# Patient Record
Sex: Male | Born: 1985 | Race: White | Hispanic: No | Marital: Single | Smoking: Current every day smoker
Health system: Southern US, Community
[De-identification: ages and names within clinical notes are randomized; demographics above are authoritative.]

## PROBLEM LIST (undated history)

## (undated) DIAGNOSIS — C801 Malignant (primary) neoplasm, unspecified: Secondary | ICD-10-CM

## (undated) HISTORY — PX: WISDOM TOOTH EXTRACTION: SHX21

---

## 2017-09-21 ENCOUNTER — Emergency Department (HOSPITAL_COMMUNITY)
Admission: EM | Admit: 2017-09-21 | Discharge: 2017-09-21 | Disposition: A | Discharge status: Home / Self Care | Payer: Self-pay | Attending: Emergency Medicine | Admitting: Emergency Medicine

## 2017-09-21 ENCOUNTER — Emergency Department (HOSPITAL_COMMUNITY): Payer: Self-pay

## 2017-09-21 ENCOUNTER — Encounter (HOSPITAL_COMMUNITY): Payer: Self-pay | Admitting: Emergency Medicine

## 2017-09-21 DIAGNOSIS — F17210 Nicotine dependence, cigarettes, uncomplicated: Secondary | ICD-10-CM | POA: Insufficient documentation

## 2017-09-21 DIAGNOSIS — S92012D Displaced fracture of body of left calcaneus, subsequent encounter for fracture with routine healing: Secondary | ICD-10-CM | POA: Insufficient documentation

## 2017-09-21 DIAGNOSIS — W12XXXD Fall on and from scaffolding, subsequent encounter: Secondary | ICD-10-CM | POA: Insufficient documentation

## 2017-09-21 DIAGNOSIS — Z4789 Encounter for other orthopedic aftercare: Secondary | ICD-10-CM | POA: Insufficient documentation

## 2017-09-21 DIAGNOSIS — S92012A Displaced fracture of body of left calcaneus, initial encounter for closed fracture: Secondary | ICD-10-CM

## 2017-09-21 NOTE — ED Triage Notes (Signed)
Pt reports to ED stating that his orthopedic office instructed him to come to ED for possible surgery on left heel. Patient broke foot couple weeks ago and in cast. Reports heel is very fractured.

## 2017-09-21 NOTE — ED Provider Notes (Signed)
[facility] DEPT Provider Note   CSN: [id number] Arrival date & time: 09/21/17  0847     History   Chief Complaint Chief Complaint  Patient presents with  . sent by ortho    HPI Sean Hoover is a 32 y.o. male.  Patient is a 32 year old male who presents with a calcaneus fracture.  He states that 2 weeks ago, he fell off some scaffolding and landed on his feet.  He had pain in his left heel.  He went to [place] where he had x-rays and was diagnosed with a calcaneus fracture.  He was placed in a cast.  He was told that it may need surgery.  He called the office yesterday in regards to further instructions and he was told to go to the emergency room for further treatment.  He has some ongoing throbbing pain but overall says it is doing well.  He denies any numbness in the toes.  He denies any other injuries from the fall.  No neck or back pain.     History reviewed. No pertinent past medical history.  There are no active problems to display for this patient.   History reviewed. No pertinent surgical history.      Home Medications    Prior to Admission medications   Medication Sig Start Date End Date Taking? Authorizing Provider  ibuprofen (ADVIL,MOTRIN) 200 MG tablet Take 400-600 mg by mouth every 6 (six) hours as needed.   Yes [provider]    Family History No family history on file.  Social History Social History   Tobacco Use  . Smoking status: Current Every Day Smoker    Types: Cigarettes  . Smokeless tobacco: Never Used  Substance Use Topics  . Alcohol use: Not on file  . Drug use: Not on file     Allergies   Penicillins   Review of Systems Review of Systems  Constitutional: Negative for fever.  Gastrointestinal: Negative for nausea and vomiting.  Musculoskeletal: Positive for arthralgias. Negative for back pain, joint swelling and neck pain.  Skin: Negative for wound.  Neurological: Negative  for weakness, numbness and headaches.     Physical Exam Updated Vital Signs BP 136/81 (BP Location: Left Arm)   Pulse 64   Temp 97.7 F (36.5 C) (Oral)   Resp 18   SpO2 98%   Physical Exam  Constitutional: He is oriented to person, place, and time. He appears well-developed and well-nourished.  HENT:  Head: Normocephalic and atraumatic.  Neck: Normal range of motion. Neck supple.  Cardiovascular: Normal rate.  Pulmonary/Chest: Effort normal.  Musculoskeletal: He exhibits no edema or tenderness.  Patient has a short leg cast in place on his left lower leg.  He has good perfusion and capillary refill to his toes.  He has normal sensation and motor function in his toes.  Neurological: He is alert and oriented to person, place, and time.  Skin: Skin is warm and dry.  Psychiatric: He has a normal mood and affect.     ED Treatments / Results  Labs (all labs ordered are listed, but only abnormal results are displayed) Labs Reviewed - No data to display  EKG None  Radiology Ct Foot Left Wo Contrast  Result Date: 09/21/2017 CLINICAL DATA:  Fracture of the calcaneus.  Possible ORIF. EXAM: CT OF THE LEFT FOOT WITHOUT CONTRAST TECHNIQUE: Multidetector CT imaging of the left foot was performed according to the standard protocol. Multiplanar CT image reconstructions were also  generated. COMPARISON:  None. FINDINGS: Bones/Joint/Cartilage Comminuted fracture of mid and posterior calcaneus with the fracture cleft extending to the middle subtalar joint and anterior margin of the posterior subtalar joint. Posterior subtalar joint articular surface is depressed 3 mm. Fracture involves the sustentaculum talus without displacement. No other acute fracture or dislocation. No aggressive osseous lesion. Ankle mortise is intact. Small anterior tibiotalar marginal osteophytes. 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 Ligaments Suboptimally assessed by CT. Muscles and Tendons Muscles are normal. No  muscle atrophy. Flexor, extensor, peroneal and Achilles tendons are grossly intact. Soft tissues No fluid collection or hematoma. Soft tissue edema around the calcaneus. IMPRESSION: 1. Comminuted fracture of mid and posterior calcaneus with the fracture cleft extending to the middle subtalar joint and anterior margin of the posterior subtalar joint. Posterior subtalar joint articular surface is depressed 3 mm. Fracture involves the sustentaculum talus without displacement. Electronically Signed   By: Kathreen Devoid   On: 09/21/2017 11:35    Procedures Procedures (including critical care time)  Medications Ordered in ED Medications - No data to display   Initial Impression / Assessment and Plan / ED Course  I have reviewed the triage vital signs and the nursing notes.  Pertinent labs & imaging results that were available during my care of the patient were reviewed by me and considered in my medical decision making (see chart for details).     I spoke with [facility] orthopedics who recommends getting a CT scan of the patient's calcaneus.  This was performed and the orthopedist has reviewed the images.  They have recommended that the patient follow-up with Dr. [name] in their office on Wednesday.  Patient was notified that he needs to call and make the appointment.  He knows all the prior instructions such as being nonweightbearing and keeping the foot elevated.  Final Clinical Impressions(s) / ED Diagnoses   Final diagnoses:  Closed displaced fracture of body of left calcaneus, initial encounter    ED Discharge Orders    None       Malvin Johns, MD 09/21/17 1213

## 2017-09-21 NOTE — ED Notes (Signed)
Pt stated he does not want his vital signs taken at the time of discharge. No acute distress noted. Pain in left foot 3/10.

## 2017-09-28 ENCOUNTER — Other Ambulatory Visit: Payer: Self-pay

## 2017-09-28 ENCOUNTER — Encounter (HOSPITAL_COMMUNITY): Payer: Self-pay | Admitting: *Deleted

## 2017-10-01 ENCOUNTER — Encounter (HOSPITAL_COMMUNITY): Payer: Self-pay | Admitting: Anesthesiology

## 2017-10-01 NOTE — Anesthesia Preprocedure Evaluation (Addendum)
Anesthesia Evaluation  Patient identified by MRN, date of birth, ID band Patient awake    Reviewed: Allergy & Precautions, NPO status , Patient's Chart, lab work & pertinent test results  Airway Mallampati: I  TM Distance: >3 FB Neck ROM: Full    Dental  (+) Teeth Intact, Dental Advisory Given   Pulmonary Current Smoker,    breath sounds clear to auscultation       Cardiovascular negative cardio ROS   Rhythm:Regular Rate:Normal     Neuro/Psych negative neurological ROS     GI/Hepatic negative GI ROS, Neg liver ROS,   Endo/Other  negative endocrine ROS  Renal/GU negative Renal ROS     Musculoskeletal negative musculoskeletal ROS (+)   Abdominal Normal abdominal exam  (+)   Peds  Hematology negative hematology ROS (+)   Anesthesia Other Findings   Reproductive/Obstetrics                            Anesthesia Physical Anesthesia Plan  ASA: I  Anesthesia Plan: General   Post-op Pain Management: GA combined w/ Regional for post-op pain   Induction: Intravenous  PONV Risk Score and Plan: 2 and Ondansetron, Dexamethasone and Midazolam  Airway Management Planned: Oral ETT  Additional Equipment: None  Intra-op Plan:   Post-operative Plan: Extubation in OR  Informed Consent: I have reviewed the patients History and Physical, chart, labs and discussed the procedure including the risks, benefits and alternatives for the proposed anesthesia with the patient or authorized representative who has indicated his/her understanding and acceptance.   Dental advisory given  Plan Discussed with: CRNA  Anesthesia Plan Comments:       Anesthesia Quick Evaluation

## 2017-10-01 NOTE — H&P (Signed)
Orthopaedic Trauma Service (OTS) Consult   Patient ID: Sean Hoover MRN: [id number] DOB/AGE: 33/05/1985 33 y.o.   HPI: Sean Hoover is an 32 y.o. white male who sustained an injury to his left calcaneus approximately 3 weeks ago.  Patient was injured while at work when he fell off a scaffold.  Patient initially was seen and evaluated at [facility] and referred for CT scan he is subsequently followed up in the emergency department where CT scan was performed which demonstrated a severely comminuted left calcaneus fracture he was then referred to the orthopedic trauma specialist for further management.  Patient was seen and evaluated in our office last week.  His soft tissue swelling was controlled well enough to allow for surgical intervention.  Patient presents today for ORIF of his left calcaneus.  Patient has not been on any anticoagulation  Patient does smoke about 1/2 pack a day. No diabetes no other pertinent medical history  Patient is very active, works in [employer]  Patient does have a penicillin allergy but is uncertain if he is tolerant to cephalosporins  Past Medical History:  Diagnosis Date  . 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 WISDOM TOOTH EXTRACTION      History reviewed. No pertinent family history.  Social History:  reports that he has been smoking cigarettes.  He has a 12.00 pack-year smoking history. His smokeless tobacco use includes chew. He reports that he does not drink alcohol or use drugs.  Allergies:  Allergies  Allergen Reactions  . Penicillins Hives and Rash    Has patient had a PCN reaction causing immediate rash, facial/tongue/throat swelling, SOB or lightheadedness with hypotension: No Has patient had a PCN reaction causing severe rash involving mucus membranes or skin necrosis: No Has patient had a PCN reaction that required hospitalization: No Has  patient had a PCN reaction occurring within the last 10 years: No If all of the above answers are "NO", then may proceed with Cephalosporin use.      Medications: I have reviewed the patient's current medications. No outpatient medications have been marked as taking for the 10/02/17 encounter [facility] Encounter).    Labs Pending  Review of Systems  Constitutional: Negative for chills and fever.  Respiratory: Negative for shortness of breath and wheezing.   Cardiovascular: Negative for chest pain and palpitations.  Gastrointestinal: Negative for nausea and vomiting.  Genitourinary: Negative for dysuria.  Neurological: Negative for tingling and sensory change.   Height 6\' 1"  (1.854 m), weight 65.3 kg (144 lb).  Vital signs on arrival to short stay  Physical Exam  Constitutional: He is oriented to person, place, and time. He appears well-developed and well-nourished.  HENT:  Head: Normocephalic and atraumatic.  Neck: Normal range of motion.  Cardiovascular: Normal rate, regular rhythm and normal heart sounds.  Pulmonary/Chest: Effort normal. No respiratory distress. He has no wheezes.  Decreased at bases  Abdominal: Soft. Bowel sounds are normal. He exhibits no distension. There is no tenderness.  Musculoskeletal:  Left lower extremity Soft tissue over the lateral aspect of the left hindfoot is in good condition skin wrinkles with gentle compression Lower extremity with minimal swelling including the foot No significant pain with ankle range of motion minimal discomfort with subtalar motion Extremity is warm + DP pulse Knee is nontender No pain with axial loading or logrolling of the hip Full knee and hip range of motion DPN,  SPN, TN sensory functions are intact EHL, FHL, anterior tibialis, posterior tibialis, peroneals and gastrocsoleus complex motor functions are intact Compartments are soft No deep calf tenderness  Neurological: He is alert and oriented to person, place,  and time.  Skin: Skin is warm and dry. Capillary refill takes less than 2 seconds.  Psychiatric: He has a normal mood and affect. His behavior is normal. Judgment and thought content normal.     Assessment/Plan:  32 year old white male with depressed and comminuted intra-articular left calcaneus fracture  -Comminuted left calcaneus fracture  OR for ORIF to restore height and stability  Nonweightbearing postoperatively for 8 weeks  Admit postoperatively for pain control and therapy   Okay with preoperative block   Multimodal analgesia   Risks and benefits reviewed with the patient, he wishes to proceed   Did discuss that nicotine use would increase his risk for the development of infection and or nonunion    - Dispo:  OR for ORIF left calcaneus     Jari Pigg, PA-C [facility] [phone] (C) [phone] (O) 10/01/2017, 7:53 PM

## 2017-10-02 ENCOUNTER — Ambulatory Visit (HOSPITAL_COMMUNITY): Payer: Self-pay | Admitting: Anesthesiology

## 2017-10-02 ENCOUNTER — Ambulatory Visit (HOSPITAL_COMMUNITY): Payer: Self-pay

## 2017-10-02 ENCOUNTER — Encounter (HOSPITAL_COMMUNITY): Payer: Self-pay | Admitting: *Deleted

## 2017-10-02 ENCOUNTER — Ambulatory Visit (HOSPITAL_COMMUNITY)
Admission: RE | Admit: 2017-10-02 | Discharge: 2017-10-02 | Disposition: A | Discharge status: Home / Self Care | Payer: Self-pay | Source: Ambulatory Visit | Attending: Orthopedic Surgery | Admitting: Orthopedic Surgery

## 2017-10-02 ENCOUNTER — Encounter (HOSPITAL_COMMUNITY)
Admission: RE | Disposition: A | Discharge status: Home / Self Care | Payer: Self-pay | Source: Ambulatory Visit | Attending: Orthopedic Surgery

## 2017-10-02 DIAGNOSIS — Y990 Civilian activity done for income or pay: Secondary | ICD-10-CM | POA: Insufficient documentation

## 2017-10-02 DIAGNOSIS — T148XXA Other injury of unspecified body region, initial encounter: Secondary | ICD-10-CM

## 2017-10-02 DIAGNOSIS — W12XXXA Fall on and from scaffolding, initial encounter: Secondary | ICD-10-CM | POA: Insufficient documentation

## 2017-10-02 DIAGNOSIS — F17220 Nicotine dependence, chewing tobacco, uncomplicated: Secondary | ICD-10-CM | POA: Insufficient documentation

## 2017-10-02 DIAGNOSIS — S92062A Displaced intraarticular fracture of left calcaneus, initial encounter for closed fracture: Secondary | ICD-10-CM | POA: Insufficient documentation

## 2017-10-02 DIAGNOSIS — F17210 Nicotine dependence, cigarettes, uncomplicated: Secondary | ICD-10-CM | POA: Insufficient documentation

## 2017-10-02 DIAGNOSIS — Z419 Encounter for procedure for purposes other than remedying health state, unspecified: Secondary | ICD-10-CM

## 2017-10-02 DIAGNOSIS — Z880 Allergy status to penicillin: Secondary | ICD-10-CM | POA: Insufficient documentation

## 2017-10-02 HISTORY — PX: ORIF CALCANEOUS FRACTURE: SHX5030

## 2017-10-02 HISTORY — DX: Malignant (primary) neoplasm, unspecified: C80.1

## 2017-10-02 LAB — CBC
HEMATOCRIT: 53.5 % — AB (ref 39.0–52.0)
Hemoglobin: 18 g/dL — ABNORMAL HIGH (ref 13.0–17.0)
MCH: 28.4 pg (ref 26.0–34.0)
MCHC: 33.6 g/dL (ref 30.0–36.0)
MCV: 84.5 fL (ref 78.0–100.0)
Platelets: 253 10*3/uL (ref 150–400)
RBC: 6.33 MIL/uL — ABNORMAL HIGH (ref 4.22–5.81)
RDW: 12.8 % (ref 11.5–15.5)
WBC: 7.8 10*3/uL (ref 4.0–10.5)

## 2017-10-02 SURGERY — OPEN REDUCTION INTERNAL FIXATION (ORIF) CALCANEOUS FRACTURE
Anesthesia: General | Site: Foot | Laterality: Left

## 2017-10-02 MED ORDER — LIDOCAINE HCL (CARDIAC) PF 100 MG/5ML IV SOSY
PREFILLED_SYRINGE | INTRAVENOUS | Status: DC | PRN
  Administered 2017-10-02: 100 mg via INTRAVENOUS

## 2017-10-02 MED ORDER — HYDROMORPHONE HCL 2 MG/ML IJ SOLN: 0.2500 mg | INTRAMUSCULAR | Status: DC | PRN

## 2017-10-02 MED ORDER — LACTATED RINGERS IV SOLN: INTRAVENOUS | Status: DC

## 2017-10-02 MED ORDER — FENTANYL CITRATE (PF) 100 MCG/2ML IJ SOLN
50.0000 ug | Freq: Once | INTRAMUSCULAR | Status: AC
  Administered 2017-10-02: 50 ug via INTRAVENOUS

## 2017-10-02 MED ORDER — PROPOFOL 10 MG/ML IV BOLUS
INTRAVENOUS | Status: AC
  Filled 2017-10-02: qty 20

## 2017-10-02 MED ORDER — GABAPENTIN 300 MG PO CAPS
300.0000 mg | ORAL_CAPSULE | Freq: Once | ORAL | Status: AC
  Administered 2017-10-02: 300 mg via ORAL
  Filled 2017-10-02: qty 1

## 2017-10-02 MED ORDER — PROPOFOL 10 MG/ML IV BOLUS
INTRAVENOUS | Status: DC | PRN
  Administered 2017-10-02: 170 mg via INTRAVENOUS

## 2017-10-02 MED ORDER — LACTATED RINGERS IV SOLN
INTRAVENOUS | Status: DC | PRN
  Administered 2017-10-02 (×2): via INTRAVENOUS

## 2017-10-02 MED ORDER — ROCURONIUM BROMIDE 100 MG/10ML IV SOLN
INTRAVENOUS | Status: DC | PRN
  Administered 2017-10-02: 55 mg via INTRAVENOUS
  Administered 2017-10-02: 10 mg via INTRAVENOUS
  Administered 2017-10-02: 15 mg via INTRAVENOUS

## 2017-10-02 MED ORDER — ONDANSETRON HCL 4 MG/2ML IJ SOLN
INTRAMUSCULAR | Status: DC | PRN
  Administered 2017-10-02: 4 mg via INTRAVENOUS

## 2017-10-02 MED ORDER — PROMETHAZINE HCL 25 MG/ML IJ SOLN: 6.2500 mg | INTRAMUSCULAR | Status: DC | PRN

## 2017-10-02 MED ORDER — FENTANYL CITRATE (PF) 100 MCG/2ML IJ SOLN
INTRAMUSCULAR | Status: DC | PRN
  Administered 2017-10-02: 150 ug via INTRAVENOUS

## 2017-10-02 MED ORDER — METHOCARBAMOL 500 MG PO TABS: 500.0000 mg | ORAL_TABLET | Freq: Four times a day (QID) | ORAL | 0 refills | Status: AC | PRN

## 2017-10-02 MED ORDER — 0.9 % SODIUM CHLORIDE (POUR BTL) OPTIME
TOPICAL | Status: DC | PRN
  Administered 2017-10-02: 1000 mL

## 2017-10-02 MED ORDER — MIDAZOLAM HCL 2 MG/2ML IJ SOLN
2.0000 mg | Freq: Once | INTRAMUSCULAR | Status: AC
  Administered 2017-10-02: 2 mg via INTRAVENOUS

## 2017-10-02 MED ORDER — OXYCODONE HCL 5 MG PO TABS: 5.0000 mg | ORAL_TABLET | ORAL | 0 refills | Status: AC | PRN

## 2017-10-02 MED ORDER — MIDAZOLAM HCL 2 MG/2ML IJ SOLN
INTRAMUSCULAR | Status: AC
  Filled 2017-10-02: qty 2

## 2017-10-02 MED ORDER — HYDROCODONE-ACETAMINOPHEN 10-325 MG PO TABS: 1.0000 | ORAL_TABLET | Freq: Four times a day (QID) | ORAL | 0 refills | Status: AC | PRN

## 2017-10-02 MED ORDER — CHLORHEXIDINE GLUCONATE 4 % EX LIQD: 60.0000 mL | Freq: Once | CUTANEOUS | Status: DC

## 2017-10-02 MED ORDER — MEPERIDINE HCL 50 MG/ML IJ SOLN: 6.2500 mg | INTRAMUSCULAR | Status: DC | PRN

## 2017-10-02 MED ORDER — FENTANYL CITRATE (PF) 100 MCG/2ML IJ SOLN
INTRAMUSCULAR | Status: AC
  Filled 2017-10-02: qty 2

## 2017-10-02 MED ORDER — SUGAMMADEX SODIUM 200 MG/2ML IV SOLN
INTRAVENOUS | Status: DC | PRN
  Administered 2017-10-02: 150 mg via INTRAVENOUS

## 2017-10-02 MED ORDER — FENTANYL CITRATE (PF) 250 MCG/5ML IJ SOLN
INTRAMUSCULAR | Status: AC
  Filled 2017-10-02: qty 5

## 2017-10-02 MED ORDER — ACETAMINOPHEN 500 MG PO TABS: 500.0000 mg | ORAL_TABLET | Freq: Four times a day (QID) | ORAL | 0 refills | Status: AC

## 2017-10-02 MED ORDER — BUPIVACAINE-EPINEPHRINE (PF) 0.5% -1:200000 IJ SOLN
INTRAMUSCULAR | Status: DC | PRN
  Administered 2017-10-02: 30 mL via PERINEURAL

## 2017-10-02 MED ORDER — CEFAZOLIN SODIUM-DEXTROSE 2-4 GM/100ML-% IV SOLN
2.0000 g | INTRAVENOUS | Status: AC
  Administered 2017-10-02: 2 g via INTRAVENOUS
  Filled 2017-10-02: qty 100

## 2017-10-02 MED ORDER — ACETAMINOPHEN 500 MG PO TABS
1000.0000 mg | ORAL_TABLET | Freq: Once | ORAL | Status: AC
  Administered 2017-10-02: 1000 mg via ORAL
  Filled 2017-10-02: qty 2

## 2017-10-02 MED ORDER — DEXAMETHASONE SODIUM PHOSPHATE 10 MG/ML IJ SOLN
INTRAMUSCULAR | Status: DC | PRN
  Administered 2017-10-02: 10 mg via INTRAVENOUS

## 2017-10-02 MED ORDER — ENOXAPARIN SODIUM 40 MG/0.4ML ~~LOC~~ SOLN: 1.0000 mg/kg | Freq: Two times a day (BID) | SUBCUTANEOUS | 0 refills | Status: AC

## 2017-10-02 MED ORDER — ASPIRIN EC 325 MG PO TBEC: 325.0000 mg | DELAYED_RELEASE_TABLET | Freq: Every day | ORAL | 0 refills | Status: AC

## 2017-10-02 SURGICAL SUPPLY — 74 items
BANDAGE ACE 4X5 VEL STRL LF (GAUZE/BANDAGES/DRESSINGS) ×2 IMPLANT
BANDAGE ACE 6X5 VEL STRL LF (GAUZE/BANDAGES/DRESSINGS) ×2 IMPLANT
BANDAGE ESMARK 6X9 LF (GAUZE/BANDAGES/DRESSINGS) ×1 IMPLANT
BIT DRILL 2.5X2.75 QC CALB (BIT) ×2 IMPLANT
BLADE SURG 10 STRL SS (BLADE) ×2 IMPLANT
BNDG COHESIVE 4X5 TAN STRL (GAUZE/BANDAGES/DRESSINGS) ×2 IMPLANT
BNDG ESMARK 6X9 LF (GAUZE/BANDAGES/DRESSINGS) ×2
BNDG GAUZE ELAST 4 BULKY (GAUZE/BANDAGES/DRESSINGS) ×4 IMPLANT
BONE CANC CHIPS 20CC PCAN1/4 (Bone Implant) ×2 IMPLANT
BRUSH SCRUB SURG 4.25 DISP (MISCELLANEOUS) ×4 IMPLANT
CHIPS CANC BONE 20CC PCAN1/4 (Bone Implant) ×1 IMPLANT
COVER MAYO STAND STRL (DRAPES) IMPLANT
COVER SURGICAL LIGHT HANDLE (MISCELLANEOUS) ×4 IMPLANT
DRAIN TLS ROUND 10FR (DRAIN) IMPLANT
DRAPE C-ARM 42X72 X-RAY (DRAPES) ×2 IMPLANT
DRAPE C-ARMOR (DRAPES) ×2 IMPLANT
DRAPE INCISE IOBAN 66X45 STRL (DRAPES) ×2 IMPLANT
DRAPE ORTHO SPLIT 77X108 STRL (DRAPES) ×1
DRAPE SURG ORHT 6 SPLT 77X108 (DRAPES) ×1 IMPLANT
DRAPE U-SHAPE 47X51 STRL (DRAPES) ×2 IMPLANT
DRSG EMULSION OIL 3X3 NADH (GAUZE/BANDAGES/DRESSINGS) ×2 IMPLANT
ELECT REM PT RETURN 9FT ADLT (ELECTROSURGICAL) ×2
ELECTRODE REM PT RTRN 9FT ADLT (ELECTROSURGICAL) ×1 IMPLANT
GAUZE SPONGE 4X4 12PLY STRL (GAUZE/BANDAGES/DRESSINGS) ×2 IMPLANT
GLOVE BIO SURGEON STRL SZ7.5 (GLOVE) ×2 IMPLANT
GLOVE BIO SURGEON STRL SZ8 (GLOVE) ×4 IMPLANT
GLOVE BIOGEL PI IND STRL 7.5 (GLOVE) ×1 IMPLANT
GLOVE BIOGEL PI INDICATOR 7.5 (GLOVE) ×1
GOWN STRL REUS W/ TWL LRG LVL3 (GOWN DISPOSABLE) ×2 IMPLANT
GOWN STRL REUS W/ TWL XL LVL3 (GOWN DISPOSABLE) ×1 IMPLANT
GOWN STRL REUS W/TWL LRG LVL3 (GOWN DISPOSABLE) ×2
GOWN STRL REUS W/TWL XL LVL3 (GOWN DISPOSABLE) ×1
K-WIRE ACE 1.6X6 (WIRE) ×8
KIT BASIN OR (CUSTOM PROCEDURE TRAY) ×2 IMPLANT
KIT TURNOVER KIT B (KITS) ×2 IMPLANT
KWIRE ACE 1.6X6 (WIRE) ×4 IMPLANT
MANIFOLD NEPTUNE II (INSTRUMENTS) ×2 IMPLANT
NEEDLE HYPO 21X1.5 SAFETY (NEEDLE) IMPLANT
NS IRRIG 1000ML POUR BTL (IV SOLUTION) ×2 IMPLANT
PACK ORTHO EXTREMITY (CUSTOM PROCEDURE TRAY) ×2 IMPLANT
PAD ARMBOARD 7.5X6 YLW CONV (MISCELLANEOUS) ×4 IMPLANT
PAD CAST 4YDX4 CTTN HI CHSV (CAST SUPPLIES) ×1 IMPLANT
PADDING CAST COTTON 4X4 STRL (CAST SUPPLIES) ×1
PADDING CAST COTTON 6X4 STRL (CAST SUPPLIES) ×2 IMPLANT
PLATE LOCK LG LT CALC FOOT (Plate) ×2 IMPLANT
SCREW 3.5MM CORT LP 34MM (Screw) ×2 IMPLANT
SCREW CORT T15 28X3.5XST LCK (Screw) ×1 IMPLANT
SCREW CORT T15 30X3.5XST LCK (Screw) ×1 IMPLANT
SCREW CORTICAL 3.5X28MM (Screw) ×1 IMPLANT
SCREW CORTICAL 3.5X30MM (Screw) ×1 IMPLANT
SCREW CORTICAL 3.5X46MM (Screw) ×2 IMPLANT
SCREW LOCK CORT STAR 3.5X26 (Screw) ×2 IMPLANT
SCREW LOCK CORT STAR 3.5X30 (Screw) ×2 IMPLANT
SCREW LOCK CORT STAR 3.5X32 (Screw) ×2 IMPLANT
SCREW LOCK CORT STAR 3.5X36 (Screw) ×4 IMPLANT
SCREW LP 3.5X38MM (Screw) ×2 IMPLANT
SCREW LP 3.5X44 (Screw) ×2 IMPLANT
SCREW SHANZ 4.0X60MM (EXFIX) ×2 IMPLANT
SPLINT PLASTER CAST XFAST 5X30 (CAST SUPPLIES) ×1 IMPLANT
SPLINT PLASTER XFAST SET 5X30 (CAST SUPPLIES) ×1
SPONGE LAP 18X18 X RAY DECT (DISPOSABLE) IMPLANT
SUCTION FRAZIER HANDLE 10FR (MISCELLANEOUS) ×1
SUCTION FRAZIER TIP 10 FR DISP (SUCTIONS) ×2 IMPLANT
SUCTION TUBE FRAZIER 10FR DISP (MISCELLANEOUS) ×1 IMPLANT
SUT ETHILON 3 0 PS 1 (SUTURE) ×4 IMPLANT
SUT VIC AB 2-0 CT3 27 (SUTURE) IMPLANT
SUT VIC AB 2-0 SH 18 (SUTURE) IMPLANT
SUT VIC AB 3-0 FS2 27 (SUTURE) ×2 IMPLANT
SYR CONTROL 10ML LL (SYRINGE) IMPLANT
TOWEL OR 17X24 6PK STRL BLUE (TOWEL DISPOSABLE) ×2 IMPLANT
TOWEL OR 17X26 10 PK STRL BLUE (TOWEL DISPOSABLE) ×2 IMPLANT
TUBE CONNECTING 12X1/4 (SUCTIONS) ×2 IMPLANT
UNDERPAD 30X30 (UNDERPADS AND DIAPERS) ×2 IMPLANT
WATER STERILE IRR 1000ML POUR (IV SOLUTION) ×2 IMPLANT

## 2017-10-02 NOTE — Anesthesia Procedure Notes (Signed)
Anesthesia Regional Block: Popliteal block   Pre-Anesthetic Checklist: ,, timeout performed, Correct Patient, Correct Site, Correct Laterality, Correct Procedure, Correct Position, site marked, Risks and benefits discussed,  Surgical consent,  Pre-op evaluation,  At surgeon's request and post-op pain management  Laterality: Left  Prep: chloraprep       Needles:  Injection technique: Single-shot  Needle Type: Echogenic Needle     Needle Length: 9cm  Needle Gauge: 21     Additional Needles:   Procedures:,,,, ultrasound used (permanent image in chart),,,,  Narrative:  Start time: 10/02/2017 8:10 AM End time: 10/02/2017 8:20 AM Injection made incrementally with aspirations every 5 mL.  Performed by: Personally  Anesthesiologist: Effie Berkshire, MD  Additional Notes: Patient tolerated the procedure well. Local anesthetic introduced in an incremental fashion under minimal resistance after negative aspirations. No paresthesias were elicited. After completion of the procedure, no acute issues were identified and patient continued to be monitored by RN.

## 2017-10-02 NOTE — Progress Notes (Signed)
PA Lanny Hurst notified of saturated ace splint to LLE. Will come to bedside and assess before proceeding with discharge.

## 2017-10-02 NOTE — Transfer of Care (Signed)
Immediate Anesthesia Transfer of Care Note  Patient: Sean Hoover  Procedure(s) Performed: OPEN REDUCTION INTERNAL FIXATION (ORIF) LEFT  CALCANEOUS FRACTURE (Left Foot)  Patient Location: PACU  Anesthesia Type:GA combined with regional for post-op pain  Level of Consciousness: awake, alert  and oriented  Airway & Oxygen Therapy: Patient Spontanous Breathing and Patient connected to nasal cannula oxygen  Post-op Assessment: Report given to RN and Post -op Vital signs reviewed and stable  Post vital signs: Reviewed and stable  Last Vitals:  Vitals Value Taken Time  BP 118/67 10/02/2017 11:25 AM  Temp 36.4 C 10/02/2017 11:25 AM  Pulse 64 10/02/2017 11:26 AM  Resp 10 10/02/2017 11:26 AM  SpO2 99 % 10/02/2017 11:26 AM  Vitals shown include unvalidated device data.  Last Pain:  Vitals:   10/02/17 1125  TempSrc:   PainSc: 0-No pain      Patients Stated Pain Goal: 1 (14/43/15 4008)  Complications: No apparent anesthesia complications

## 2017-10-02 NOTE — Anesthesia Postprocedure Evaluation (Signed)
Anesthesia Post Note  Patient: Sean Hoover  Procedure(s) Performed: OPEN REDUCTION INTERNAL FIXATION (ORIF) LEFT  CALCANEOUS FRACTURE (Left Foot)     Patient location during evaluation: PACU Anesthesia Type: General and Regional Level of consciousness: awake and alert Pain management: pain level controlled Vital Signs Assessment: post-procedure vital signs reviewed and stable Respiratory status: spontaneous breathing, nonlabored ventilation, respiratory function stable and patient connected to nasal cannula oxygen Cardiovascular status: blood pressure returned to baseline and stable Postop Assessment: no apparent nausea or vomiting Anesthetic complications: no    Last Vitals:  Vitals:   10/02/17 1320 10/02/17 1350  BP: 115/70 112/75  Pulse: 64 60  Resp: 13 12  Temp:  36.9 C  SpO2: 98% 97%    Last Pain:  Vitals:   10/02/17 1324  TempSrc:   PainSc: 0-No pain                 Effie Berkshire

## 2017-10-02 NOTE — Op Note (Signed)
10/02/2017  6:41 PM  PATIENT:  Sean Hoover  32 y.o. male  PRE-OPERATIVE DIAGNOSIS:  LEFT CALCANEUS FRACTURE  POST-OPERATIVE DIAGNOSIS:  LEFT CALCANEUS FRACTURE  PROCEDURE:  Procedure(s): OPEN REDUCTION INTERNAL FIXATION (ORIF) LEFT  CALCANEUS FRACTURE (Left)  SURGEON:  Surgeon(s) and Role:    Altamese Earlsboro, MD - Primary  ASSISTANTS: Ky Barban, RNFA   ANESTHESIA:   regional and general  EBL:  25 mL   BLOOD ADMINISTERED:none  DRAINS: none   LOCAL MEDICATIONS USED:  NONE  SPECIMEN:  No Specimen  DISPOSITION OF SPECIMEN:  N/A  COUNTS:  YES  TOURNIQUET:   Total Tourniquet Time Documented: area (laterality) - 75 minutes area (laterality) - 35 minutes Total: area (laterality) - 110 minutes   DICTATION: .Note written in EPIC  PLAN OF CARE: Discharge to home after PACU  PATIENT DISPOSITION:  PACU - hemodynamically stable.   Delay start of Pharmacological VTE agent (>24hrs) due to surgical blood loss or risk of bleeding: no  BRIEF SUMMARY OF INDICATION FOR PROCEDURE:  Sean Hoover is a 32 y.o., who sustained a calcaneus fracture with articular depression,loss of height, lateral wall blowout, and varus. I discussed the risks and benefits of surgical treatment including the potential for nerve injury, vessel injury, DVT, PE, and subtalar joint injury that could require delayed arthrodesis and result in loss of motion. We also specifically discussed increased risk of wound complications that could lead to deep infection and amputation. The patient acknowledged these risks and did wish to proceed.  BRIEF SUMMARY OF PROCEDURE:  Patient was given a preoperative block and antibiotics, then taken to the operating room where general anesthesia was induced. A tourniquet was placed about the thigh. The lower extremity was prepped with chlorhexidine wash, then betadine scrub and paint, then and draped in usual sterile fashion.   After time-out, incision was marked.  Leg was elevated  and exsanguinated with an Esmarch bandage.  Tourniquet inflated to 325 mmHg.  A standard extended approach to the calcaneus was then undertaken using a #10 blade initially and then a #15 blade with meticulous attention to avoid any trauma to the soft tissue flap, the peroneal tendons, or the sural nerve. K-wires were placed into the talus at the level of the neck and body to retract the flap without any further manipulation.  The subtalar joint was inspected and noted to have significant depression. The sustentacular fragment was cleaned of hematoma and then the adjacent fragment involving the subtalar joint mobilized, cleaned of hematoma, elevated into appropriate position and fixed with a K-wire. Using the tension bow and K-wire, my assistant then manipulated the tuberosity with traction and eversion to restore height and valgus. The angle of Gissane was then reduced and held with an additional K-wire. My assistant produced valgus force was produced and then fixation placed into the tuberosity and anterior process through the plate. All screws were checked for position and length. Final films, lateral and Harris showed appropriate reduction, hardware placement, trajectory, and length and restoration of hindfoot valgus with what appeared to be an anatomic subtalar joint as well.  Wound was irrigated thoroughly.  Pressure held while tourniquet deflated.  Hemostasis obtained with electrocautery and then layered closure continued with 2-0 Vicryl for the deep periosteal layer.  Additional 2-0 Vicryl for the subcutaneous layer and 3-0 nylon for the skin.  Sterile gently compressive dressing and posterior and stirrup splint were applied.  The patient was then taken to PACU in stable condition.  Ky Barban, RN-FA,  did assist throughout.  PROGNOSIS:  Patient will be nonweightbearing for the next 6-8 weeks with graduated weight bearing in a boot thereafter.  Return to the office in 10 days, at which time, I will  anticipate initiation of motion with removal of sutures around the 2-3 week mark.  Ice, elevate, have Lovenox while in the hospital.  Discharged on Lovenox or aspirin if finances necessitate.     Sean Hoover. [name], M.D.

## 2017-10-02 NOTE — Anesthesia Procedure Notes (Signed)
Procedure Name: Intubation Date/Time: 10/02/2017 8:36 AM Performed by: Gaylene Brooks, CRNA Pre-anesthesia Checklist: Patient identified, Emergency Drugs available, Suction available and Patient being monitored Patient Re-evaluated:Patient Re-evaluated prior to induction Oxygen Delivery Method: Circle System Utilized Preoxygenation: Pre-oxygenation with 100% oxygen Induction Type: IV induction Ventilation: Two handed mask ventilation required and Mask ventilation without difficulty Laryngoscope Size: 2 and Miller Grade View: Grade I Tube type: Oral Tube size: 7.5 mm Number of attempts: 1 Airway Equipment and Method: Stylet and Oral airway Placement Confirmation: ETT inserted through vocal cords under direct vision,  positive ETCO2 and breath sounds checked- equal and bilateral Secured at: 23 cm Tube secured with: Tape Dental Injury: Teeth and Oropharynx as per pre-operative assessment

## 2017-10-02 NOTE — Discharge Instructions (Signed)
[facility] Discharge Instructions   General Discharge Instructions  WEIGHT BEARING STATUS: Nonweightbearing left leg   RANGE OF MOTION/ACTIVITY: ok to move knee and hip. Do not remove splint. Will remove splint at follow up appointment  Wound Care: keep splint clean and dry, do not remove splint   DVT/PE prophylaxis: Lovenox 40 mg subcutaneous injection daily x 10 days is preferred but if too expensive use Aspirin 325 mg po daily x 30 days   Diet: as you were eating previously.  Can use over the counter stool softeners and bowel preparations, such as Miralax, to help with bowel movements.  Narcotics can be constipating.  Be sure to drink plenty of fluids  PAIN MEDICATION USE AND EXPECTATIONS  You have likely been given narcotic medications to help control your pain.  After a traumatic event that results in an fracture (broken bone) with or without surgery, it is ok to use narcotic pain medications to help control one's pain.  We understand that everyone responds to pain differently and each individual patient will be evaluated on a regular basis for the continued need for narcotic medications. Ideally, narcotic medication use should last no more than 6-8 weeks (coinciding with fracture healing).   As a patient it is your responsibility as well to monitor narcotic medication use and report the amount and frequency you use these medications when you come to your office visit.   We would also advise that if you are using narcotic medications, you should take a dose prior to therapy to maximize you participation.  IF YOU ARE ON NARCOTIC MEDICATIONS IT IS NOT PERMISSIBLE TO OPERATE A MOTOR VEHICLE (MOTORCYCLE/CAR/TRUCK/MOPED) OR HEAVY MACHINERY DO NOT MIX NARCOTICS WITH OTHER CNS (CENTRAL NERVOUS SYSTEM) DEPRESSANTS SUCH AS ALCOHOL   STOP SMOKING OR USING NICOTINE PRODUCTS!!!!  As discussed nicotine severely impairs your body's ability to heal surgical and traumatic wounds but  also impairs bone healing.  Wounds and bone heal by forming microscopic blood vessels (angiogenesis) and nicotine is a vasoconstrictor (essentially, shrinks blood vessels).  Therefore, if vasoconstriction occurs to these microscopic blood vessels they essentially disappear and are unable to deliver necessary nutrients to the healing tissue.  This is one modifiable factor that you can do to dramatically increase your chances of healing your injury.    (This means no smoking, no nicotine gum, patches, etc)  DO NOT USE NONSTEROIDAL ANTI-INFLAMMATORY DRUGS (NSAID'S)  Using products such as Advil (ibuprofen), Aleve (naproxen), Motrin (ibuprofen) for additional pain control during fracture healing can delay and/or prevent the healing response.  If you would like to take over the counter (OTC) medication, Tylenol (acetaminophen) is ok.  However, some narcotic medications that are given for pain control contain acetaminophen as well. Therefore, you should not exceed more than 4000 mg of tylenol in a day if you do not have liver disease.  Also note that there are may OTC medicines, such as cold medicines and allergy medicines that my contain tylenol as well.  If you have any questions about medications and/or interactions please ask your doctor/PA or your pharmacist.      ICE AND ELEVATE INJURED/OPERATIVE EXTREMITY  Using ice and elevating the injured extremity above your heart can help with swelling and pain control.  Icing in a pulsatile fashion, such as 20 minutes on and 20 minutes off, can be followed.    Do not place ice directly on skin. Make sure there is a barrier between to skin and the ice pack.  Using frozen items such as frozen peas works well as the conform nicely to the are that needs to be iced.  USE AN ACE WRAP OR TED HOSE FOR SWELLING CONTROL  In addition to icing and elevation, Ace wraps or TED hose are used to help limit and resolve swelling.  It is recommended to use Ace wraps or TED hose  until you are informed to stop.    When using Ace Wraps start the wrapping distally (farthest away from the body) and wrap proximally (closer to the body)   Example: If you had surgery on your leg or thing and you do not have a splint on, start the ace wrap at the toes and work your way up to the thigh        If you had surgery on your upper extremity and do not have a splint on, start the ace wrap at your fingers and work your way up to the upper arm  IF YOU ARE IN A SPLINT OR CAST DO NOT [place]   If your splint gets wet for any reason please contact the office immediately. You may shower in your splint or cast as long as you keep it dry.  This can be done by wrapping in a cast cover or garbage back (or similar)  Do Not stick any thing down your splint or cast such as pencils, money, or hangers to try and scratch yourself with.  If you feel itchy take benadryl as prescribed on the bottle for itching  IF YOU ARE IN A CAM BOOT (BLACK BOOT)  You may remove boot periodically. Perform daily dressing changes as noted below.  Wash the liner of the boot regularly and wear a sock when wearing the boot. It is recommended that you sleep in the boot until told otherwise  CALL THE OFFICE WITH ANY QUESTIONS OR CONCERNS: [phone]

## 2017-10-03 ENCOUNTER — Encounter (HOSPITAL_COMMUNITY): Payer: Self-pay | Admitting: Orthopedic Surgery

## 2019-06-02 IMAGING — CT CT FOOT*L* W/O CM
3 series · 12 of 33 positions shown, 14 images · non-contrast
Comparison: None.

CLINICAL DATA: Fracture of the calcaneus.  Possible ORIF.

EXAM:
CT OF THE LEFT FOOT WITHOUT CONTRAST
TECHNIQUE: Multidetector CT imaging of the left foot was performed according to
the standard protocol. Multiplanar CT image reconstructions were
also generated.

[Series 5: axial st · axial · 0.55mm/px · z∈[+246,+360]mm · 4 of 110 slices shown, 5 images]
[im 17/110  soft-tissue]
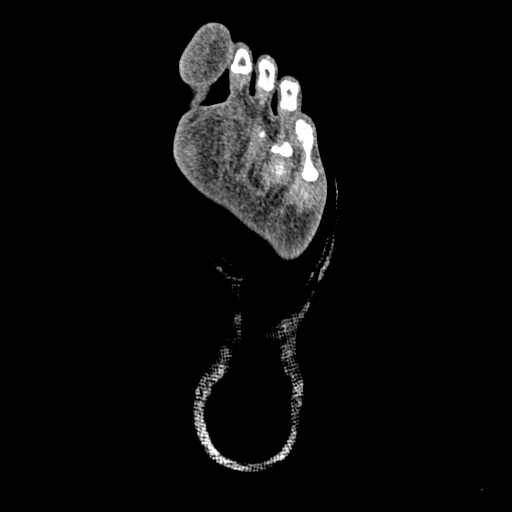
[im 17/110  bone]
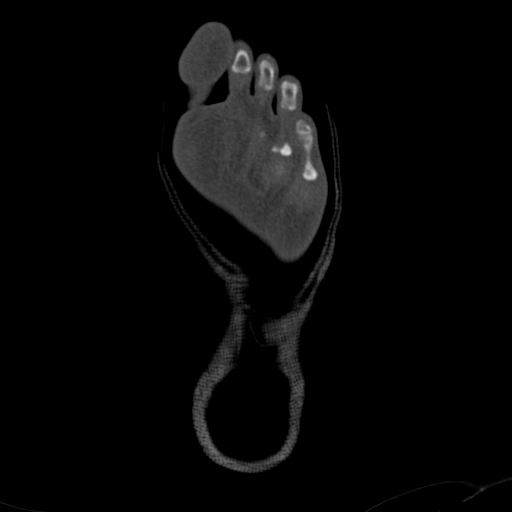
[im 42/110  bone]
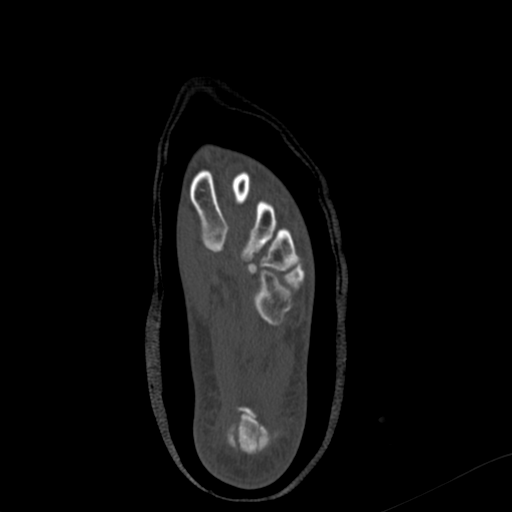
[im 68/110  bone]
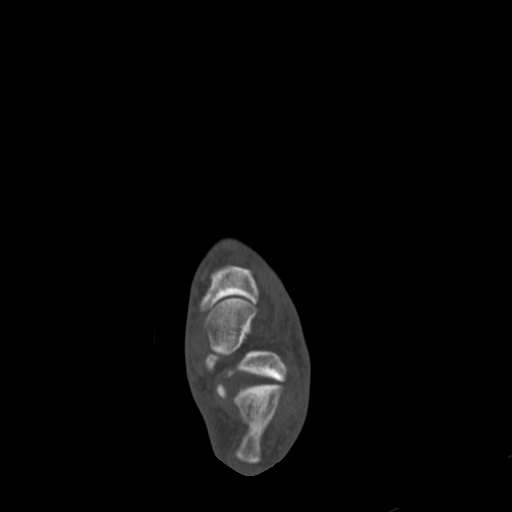
[im 93/110  bone]
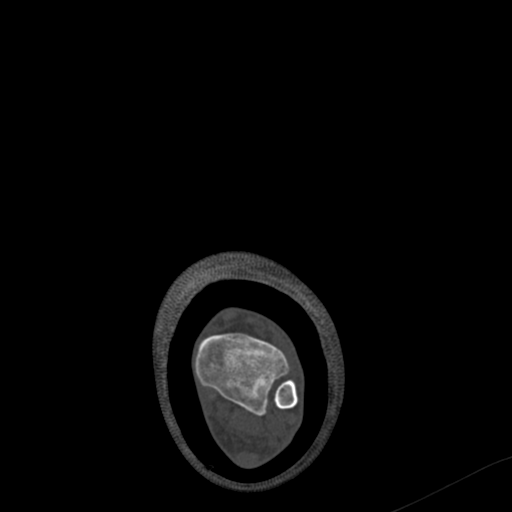

[Series 10: coronal st · coronal · 0.32mm/px · 3 of 179 slices shown]
[im 36/179  bone]
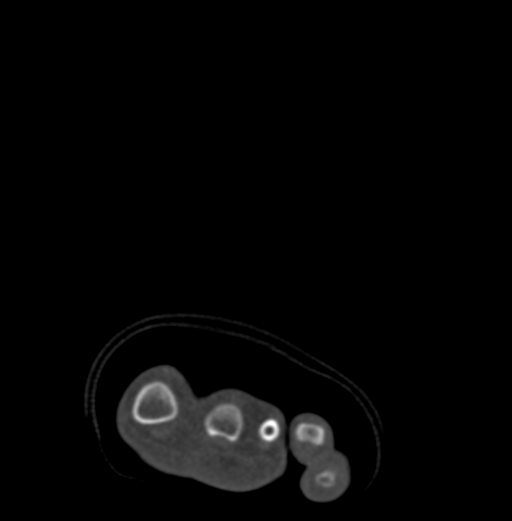
[im 72/179  bone]
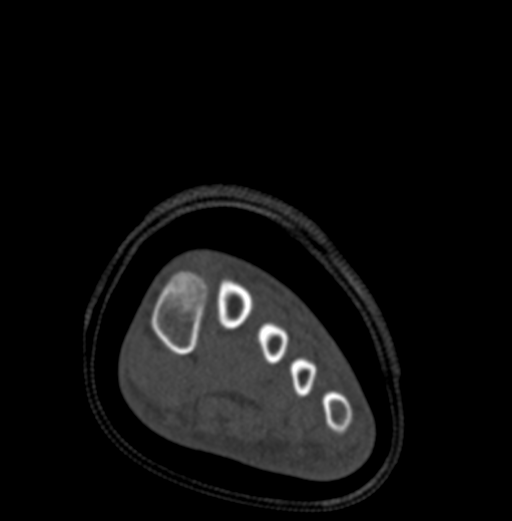
[im 107/179  bone]
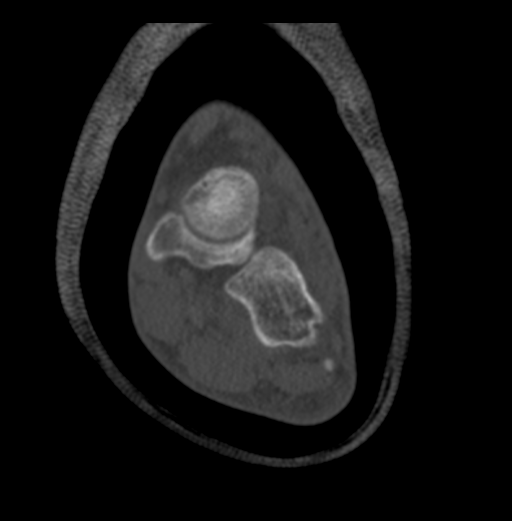

[Series 11: sagittal st · sagittal · 0.34mm/px · 5 of 110 slices shown, 6 images]
[im 37/110  bone]
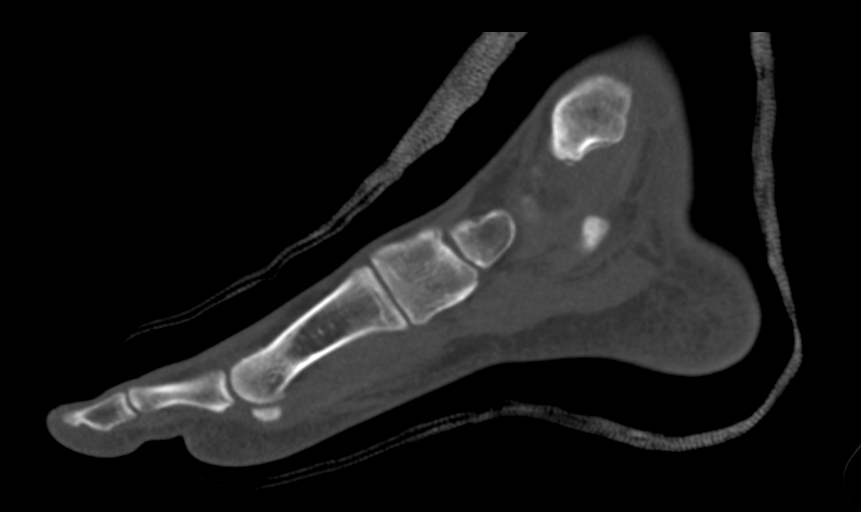
[im 46/110  bone]
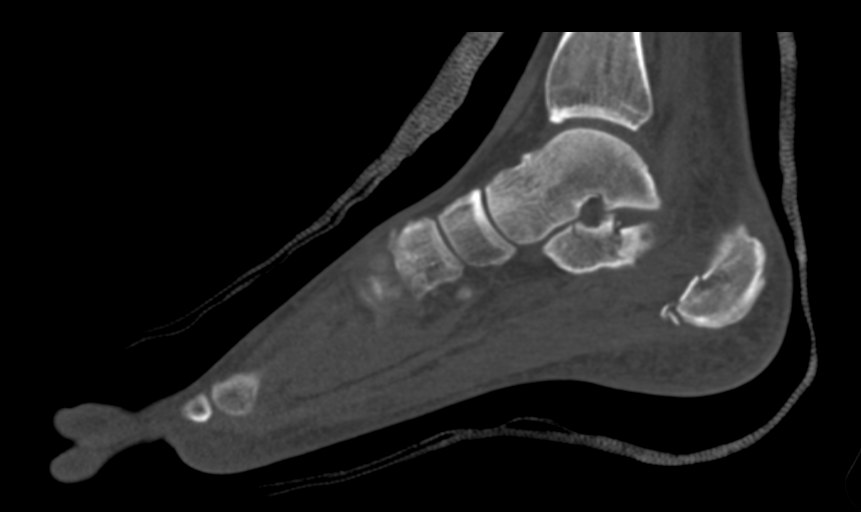
[im 55/110  soft-tissue]
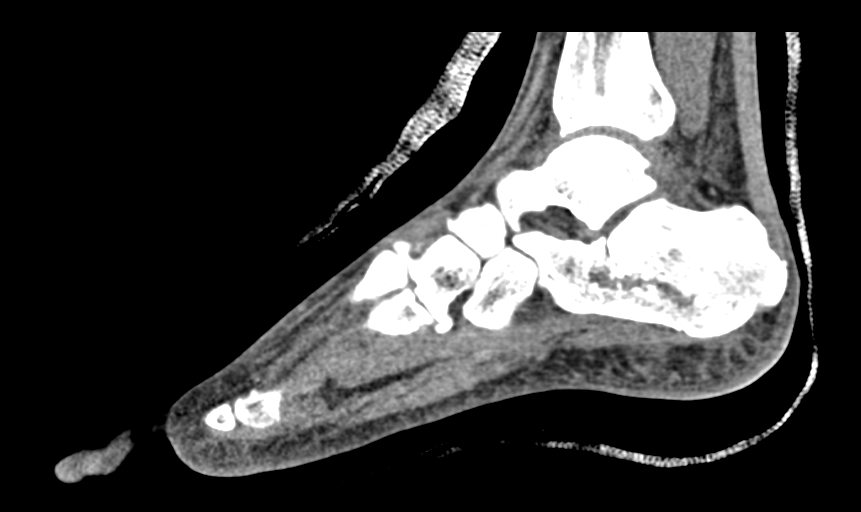
[im 55/110  bone]
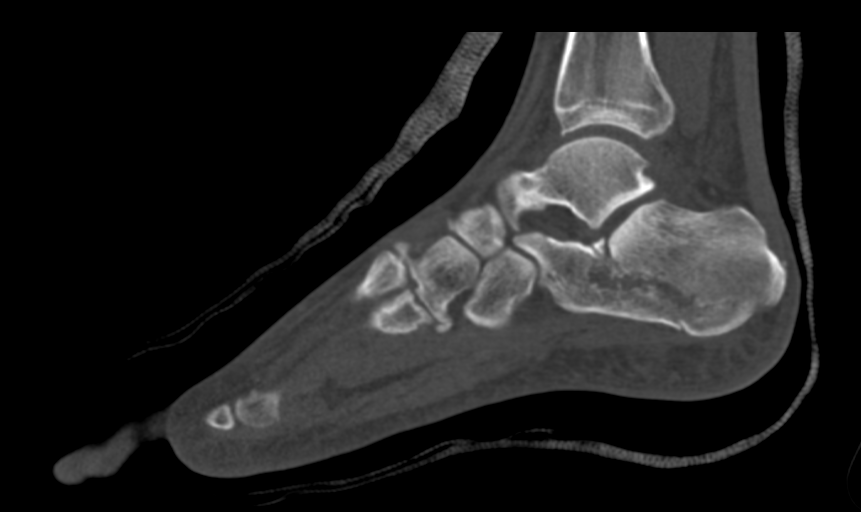
[im 64/110  bone]
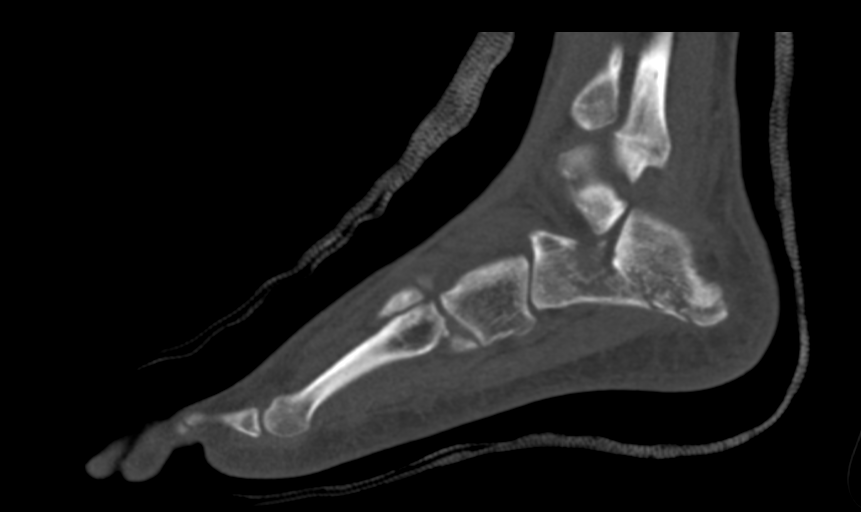
[im 73/110  bone]
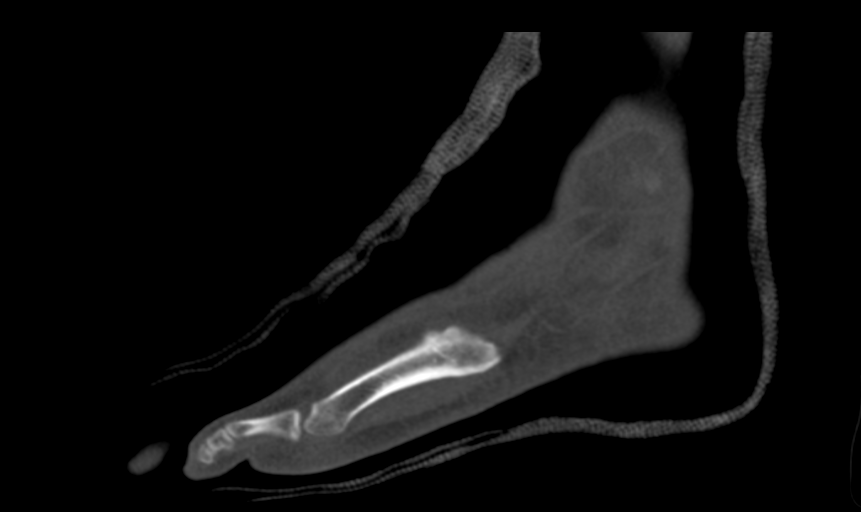

[12 of 33 positions shown; findings below may reference images not displayed]

FINDINGS: Bones/Joint/Cartilage

Comminuted fracture of mid and posterior calcaneus with the fracture
cleft extending to the middle subtalar joint and anterior margin of
the posterior subtalar joint. Posterior subtalar joint articular
surface is depressed 3 mm. Fracture involves the sustentaculum talus
without displacement.

No other acute fracture or dislocation. No aggressive osseous
lesion. Ankle mortise is intact. Small anterior tibiotalar marginal
osteophytes. Mild osteoarthritis of the second TMT joint.

Ligaments

Suboptimally assessed by CT.

Muscles and Tendons

Muscles are normal. No muscle atrophy. Flexor, extensor, peroneal
and Achilles tendons are grossly intact.

Soft tissues

No fluid collection or hematoma. Soft tissue edema around the
calcaneus.
IMPRESSION: 1. Comminuted fracture of mid and posterior calcaneus with the
fracture cleft extending to the middle subtalar joint and anterior
margin of the posterior subtalar joint. Posterior subtalar joint
articular surface is depressed 3 mm. Fracture involves the
sustentaculum talus without displacement.

## 2019-06-13 IMAGING — RF DG OS CALCIS 2+V*L*
1 series · 6 of 6 positions shown · non-contrast
Comparison: 09/21/2017

CLINICAL DATA: ORIF left calcaneus

EXAM:
DG C-ARM 61-120 MIN; LEFT OS CALCIS - 2+ VIEW

[Series 1: run · 6 of 6 slices shown]
[im 1/6]
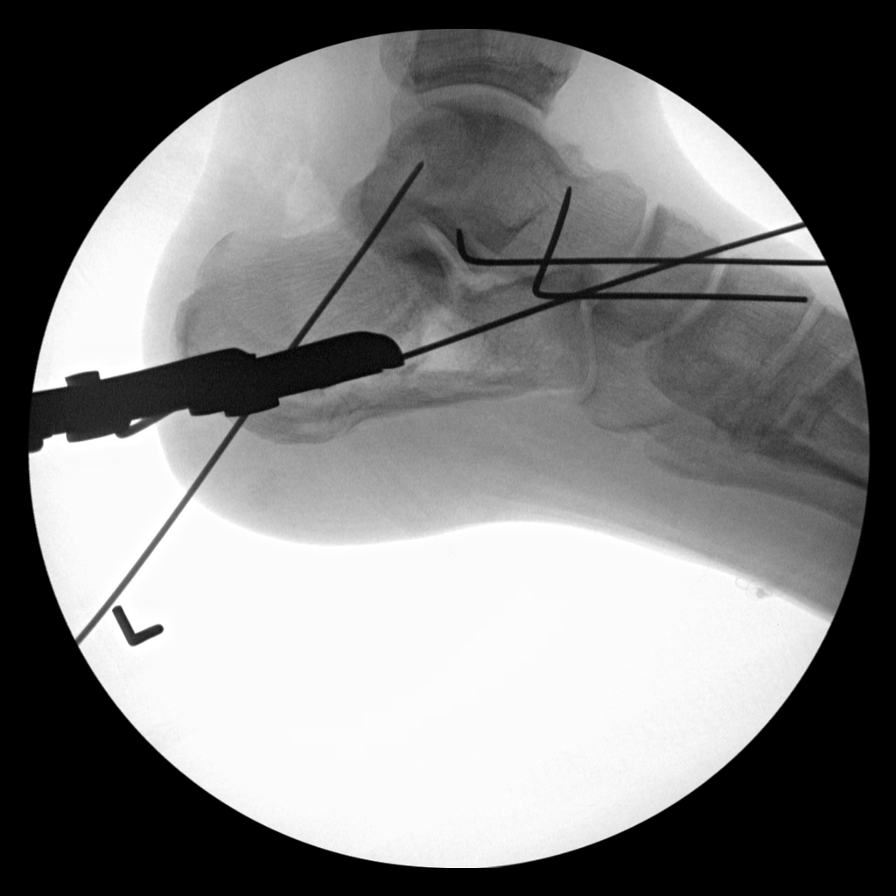
[im 2/6]
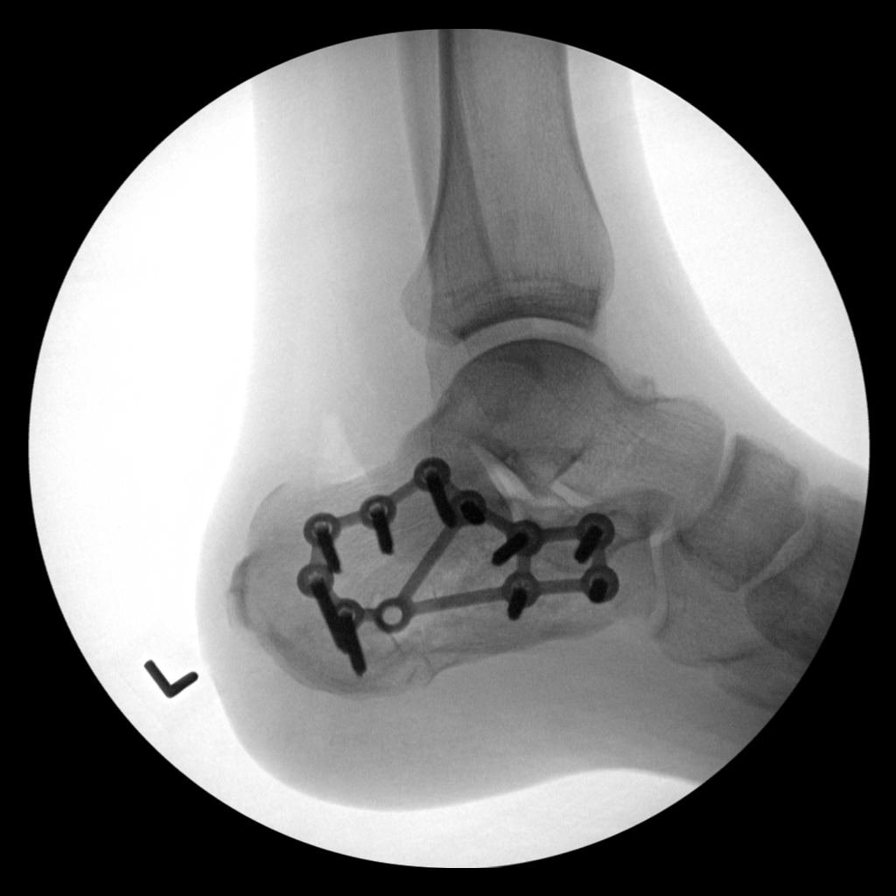
[im 3/6]
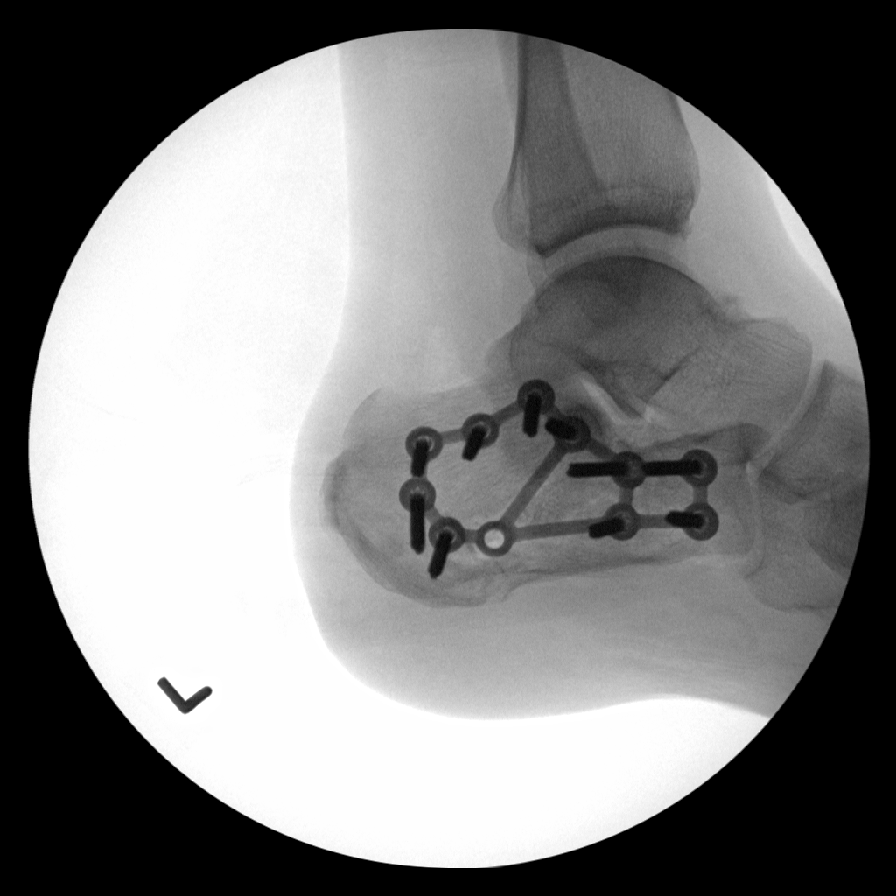
[im 4/6]
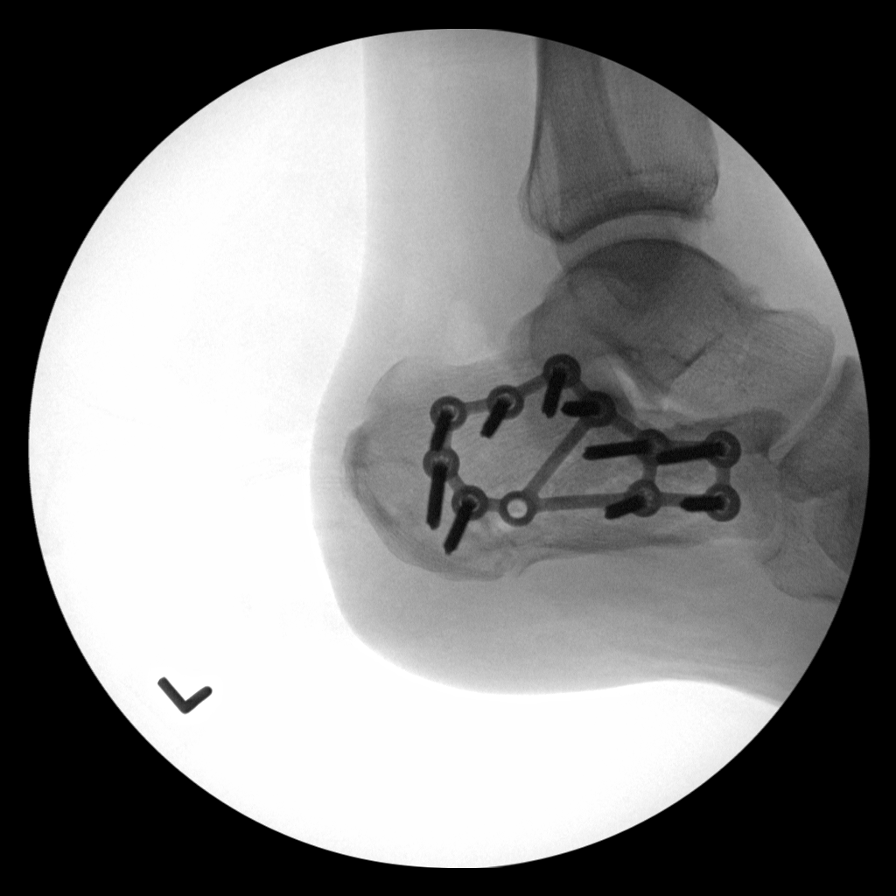
[im 5/6]
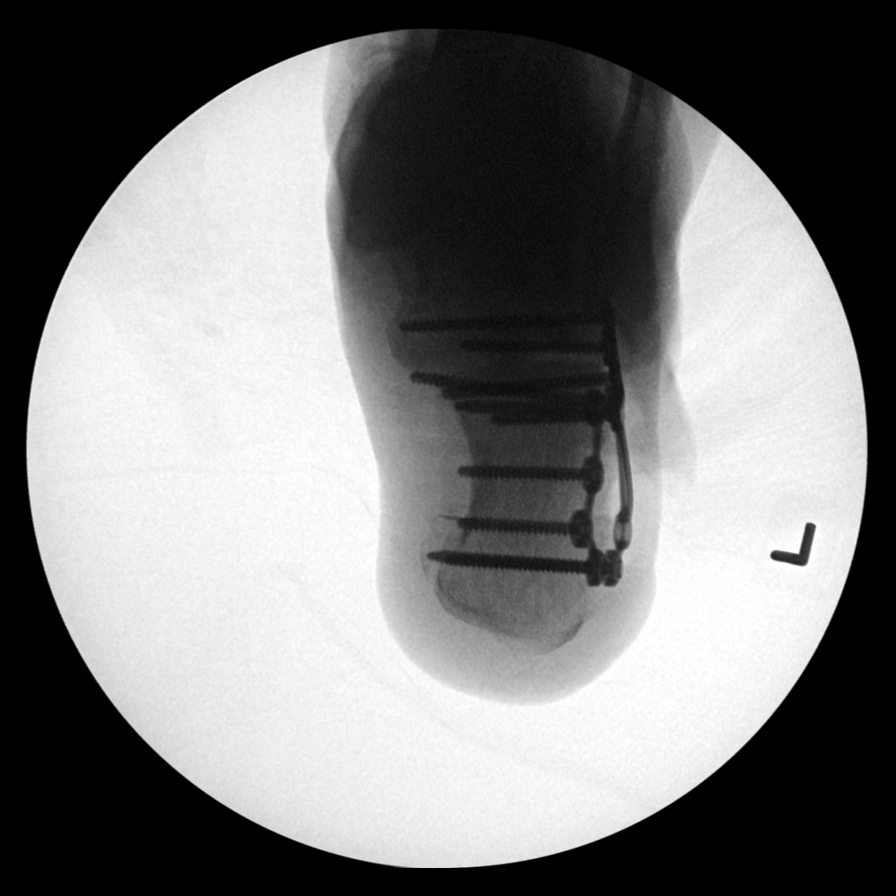
[im 6/6]
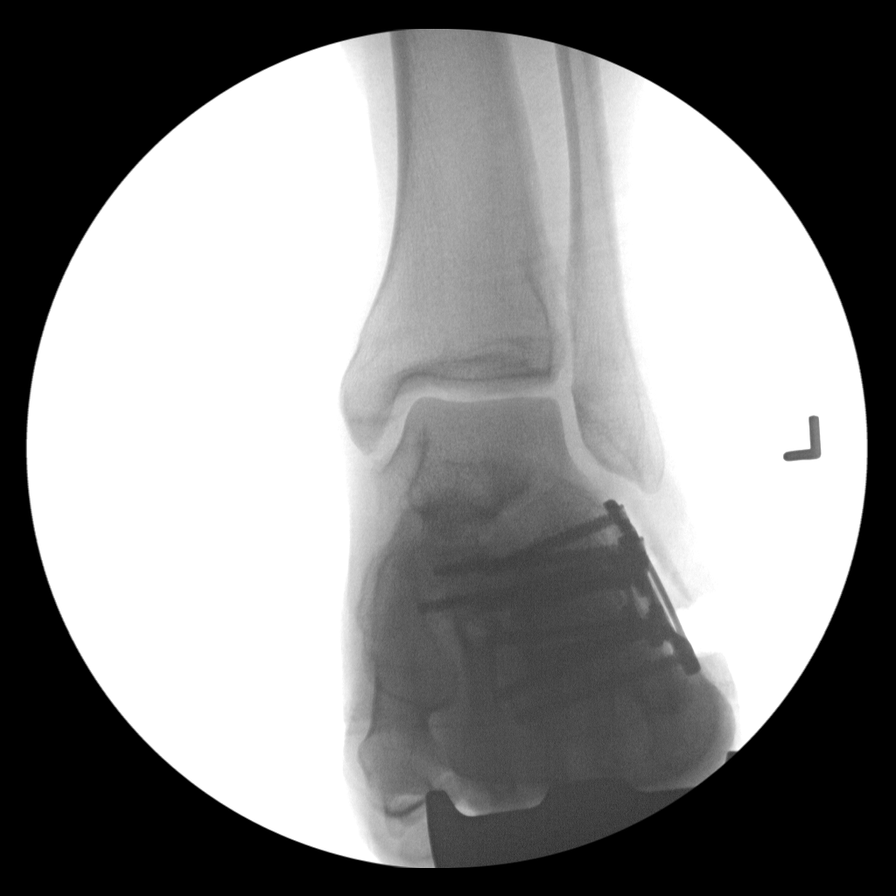

[6 of 6 positions shown; findings below may reference images not displayed]

FINDINGS: Multiple intraoperative spot images demonstrate internal fixation
across the comminuted left calcaneal fracture. Near anatomic
alignment. No hardware complicating feature.
IMPRESSION: Internal fixation of the left calcaneal fracture. No visible
complicating feature.

## 2019-06-13 IMAGING — DX DG OS CALCIS 2+V*L*
2 series · 2 of 2 positions shown · non-contrast
Comparison: 10/02/2017 and CT from 09/21/2017

CLINICAL DATA: ORIF of the left calcaneus.

EXAM:
LEFT OS CALCIS - 2+ VIEW

[calcaneus lat]
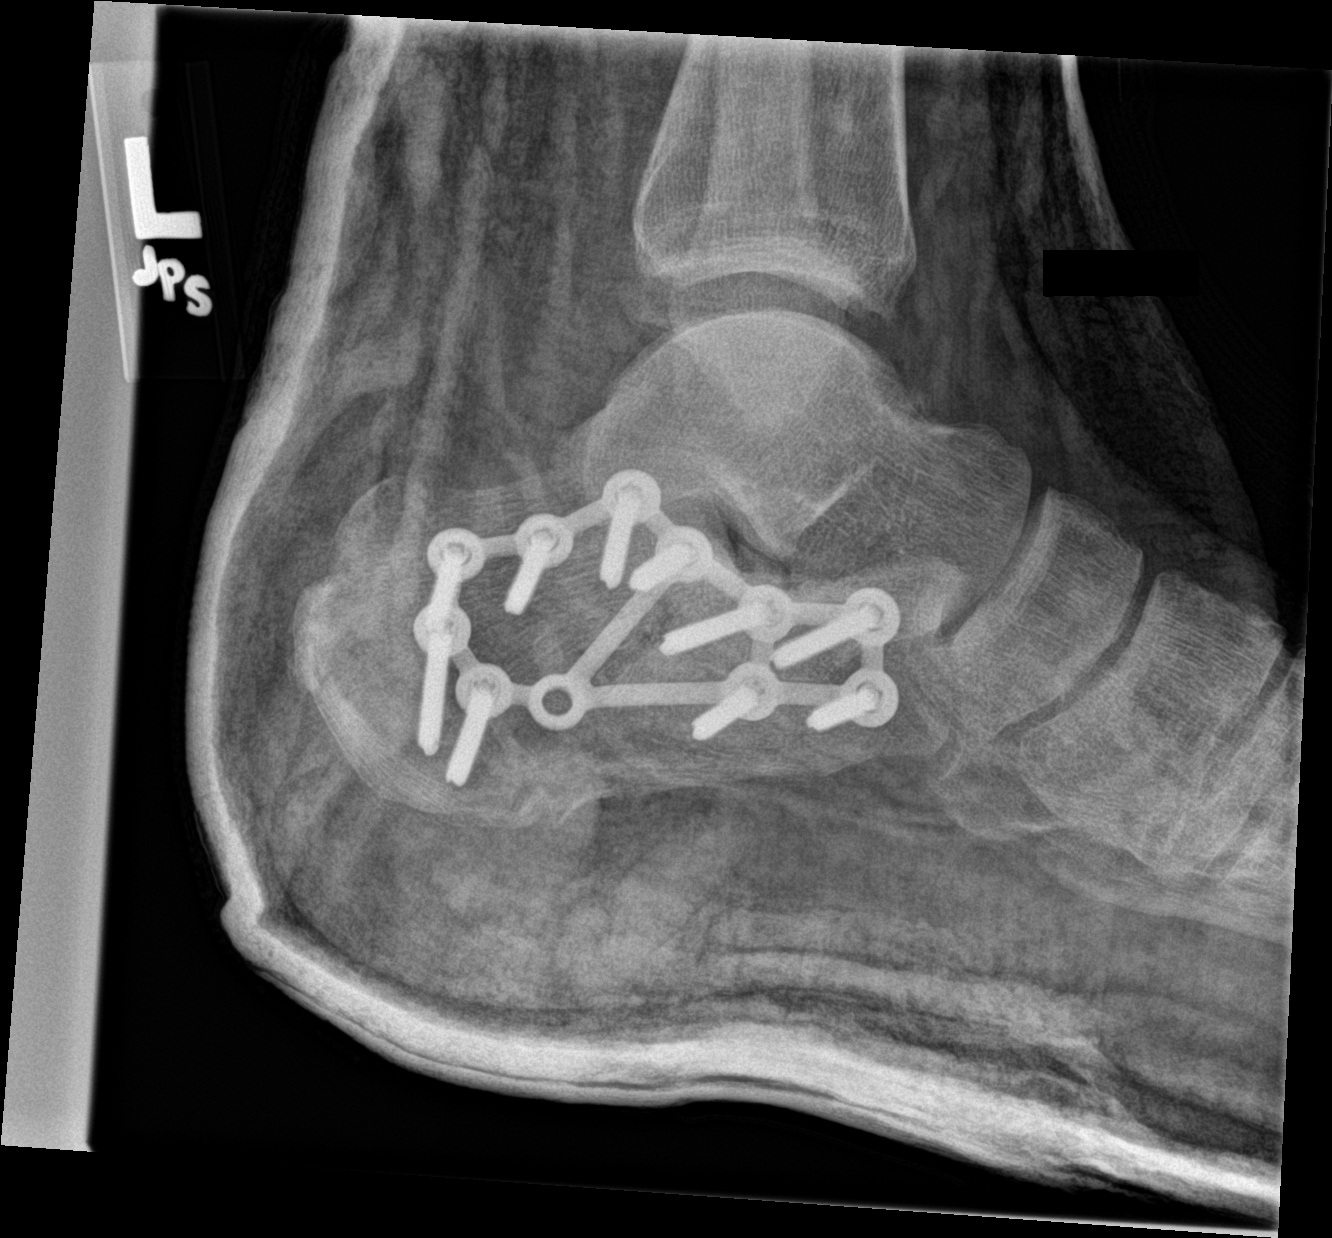

[calcaneus axial]
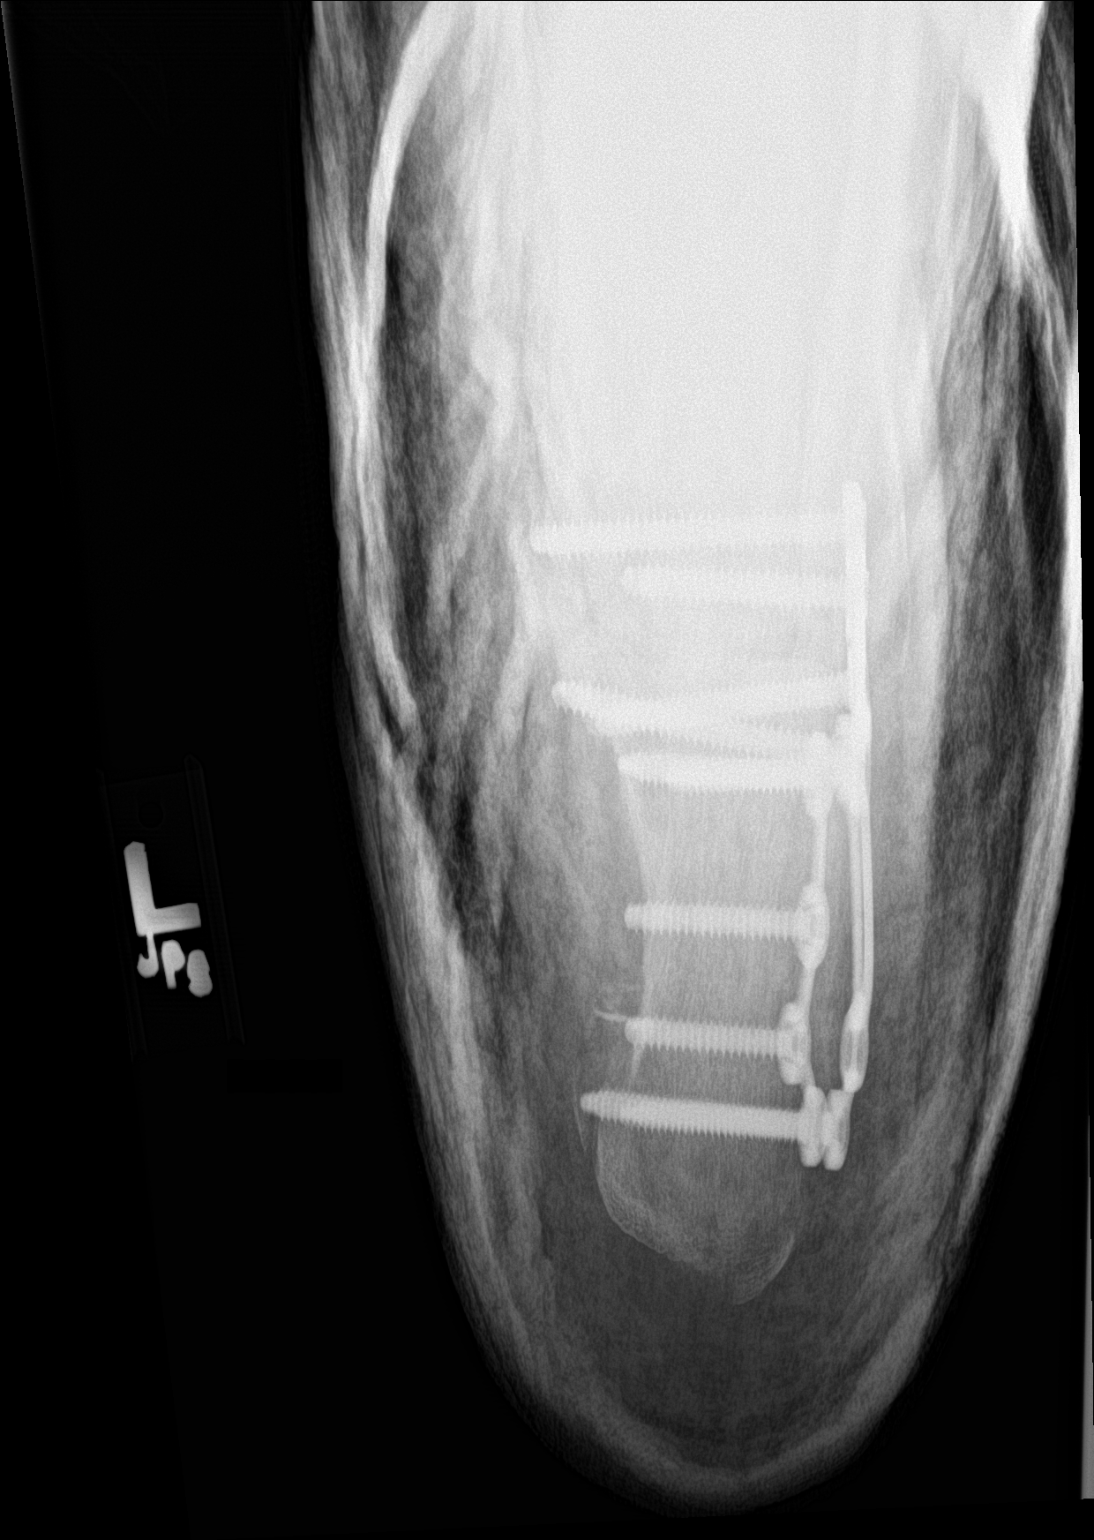

[2 of 2 positions shown; findings below may reference images not displayed]

FINDINGS: Internal fixation of the comminuted calcaneal fracture with plate
and screws. The foot is within a cast or splint. Calcaneus is
similar to the recent intraoperative images. Again noted is a
displaced fragment or fragments along the plantar aspect of the
calcaneus.
IMPRESSION: Internal fixation of the left calcaneal fracture.
